# Patient Record
Sex: Female | Born: 1970 | Marital: Married | Smoking: Current some day smoker
Health system: Southern US, Community
[De-identification: ages and names within clinical notes are randomized; demographics above are authoritative.]

## PROBLEM LIST (undated history)

## (undated) DIAGNOSIS — R51 Headache: Secondary | ICD-10-CM

## (undated) DIAGNOSIS — F419 Anxiety disorder, unspecified: Secondary | ICD-10-CM

## (undated) DIAGNOSIS — M1990 Unspecified osteoarthritis, unspecified site: Secondary | ICD-10-CM

## (undated) DIAGNOSIS — F32A Depression, unspecified: Secondary | ICD-10-CM

## (undated) DIAGNOSIS — F329 Major depressive disorder, single episode, unspecified: Secondary | ICD-10-CM

## (undated) DIAGNOSIS — K219 Gastro-esophageal reflux disease without esophagitis: Secondary | ICD-10-CM

## (undated) DIAGNOSIS — R519 Headache, unspecified: Secondary | ICD-10-CM

## (undated) HISTORY — PX: ABDOMINAL HYSTERECTOMY: SHX81

## (undated) HISTORY — PX: KNEE ARTHROSCOPY: SUR90

## (undated) HISTORY — PX: OTHER SURGICAL HISTORY: SHX169

---

## 2016-06-09 DIAGNOSIS — K0889 Other specified disorders of teeth and supporting structures: Secondary | ICD-10-CM | POA: Diagnosis not present

## 2016-07-29 DIAGNOSIS — Z1231 Encounter for screening mammogram for malignant neoplasm of breast: Secondary | ICD-10-CM | POA: Diagnosis not present

## 2016-09-22 DIAGNOSIS — M2391 Unspecified internal derangement of right knee: Secondary | ICD-10-CM | POA: Diagnosis not present

## 2016-09-22 DIAGNOSIS — G8929 Other chronic pain: Secondary | ICD-10-CM | POA: Diagnosis not present

## 2016-09-22 DIAGNOSIS — M25561 Pain in right knee: Secondary | ICD-10-CM | POA: Diagnosis not present

## 2016-09-22 DIAGNOSIS — M1711 Unilateral primary osteoarthritis, right knee: Secondary | ICD-10-CM | POA: Diagnosis not present

## 2016-09-26 ENCOUNTER — Other Ambulatory Visit: Payer: Self-pay | Admitting: Orthopedic Surgery

## 2016-09-26 DIAGNOSIS — G8929 Other chronic pain: Secondary | ICD-10-CM

## 2016-09-26 DIAGNOSIS — M25561 Pain in right knee: Principal | ICD-10-CM

## 2016-10-05 ENCOUNTER — Encounter: Payer: Self-pay | Admitting: Radiology

## 2016-10-05 ENCOUNTER — Ambulatory Visit
Admission: RE | Admit: 2016-10-05 | Discharge: 2016-10-05 | Disposition: A | Discharge status: Home / Self Care | Payer: 59 | Source: Ambulatory Visit | Attending: Orthopedic Surgery | Admitting: Orthopedic Surgery

## 2016-10-05 DIAGNOSIS — M25761 Osteophyte, right knee: Secondary | ICD-10-CM | POA: Diagnosis not present

## 2016-10-05 DIAGNOSIS — M25461 Effusion, right knee: Secondary | ICD-10-CM | POA: Diagnosis not present

## 2016-10-05 DIAGNOSIS — M23300 Other meniscus derangements, unspecified lateral meniscus, right knee: Secondary | ICD-10-CM | POA: Insufficient documentation

## 2016-10-05 DIAGNOSIS — M2241 Chondromalacia patellae, right knee: Secondary | ICD-10-CM | POA: Diagnosis not present

## 2016-10-05 DIAGNOSIS — G8929 Other chronic pain: Secondary | ICD-10-CM

## 2016-10-05 DIAGNOSIS — M25561 Pain in right knee: Secondary | ICD-10-CM | POA: Insufficient documentation

## 2016-10-13 DIAGNOSIS — M2391 Unspecified internal derangement of right knee: Secondary | ICD-10-CM | POA: Diagnosis not present

## 2016-10-25 DIAGNOSIS — G2581 Restless legs syndrome: Secondary | ICD-10-CM | POA: Diagnosis not present

## 2016-10-25 DIAGNOSIS — Z131 Encounter for screening for diabetes mellitus: Secondary | ICD-10-CM | POA: Diagnosis not present

## 2016-10-28 ENCOUNTER — Encounter
Admission: RE | Admit: 2016-10-28 | Discharge: 2016-10-28 | Disposition: A | Discharge status: Home / Self Care | Payer: 59 | Source: Ambulatory Visit | Attending: Orthopedic Surgery | Admitting: Orthopedic Surgery

## 2016-10-28 HISTORY — DX: Headache, unspecified: R51.9

## 2016-10-28 HISTORY — DX: Major depressive disorder, single episode, unspecified: F32.9

## 2016-10-28 HISTORY — DX: Depression, unspecified: F32.A

## 2016-10-28 HISTORY — DX: Headache: R51

## 2016-10-28 HISTORY — DX: Unspecified osteoarthritis, unspecified site: M19.90

## 2016-10-28 HISTORY — DX: Anxiety disorder, unspecified: F41.9

## 2016-10-28 HISTORY — DX: Gastro-esophageal reflux disease without esophagitis: K21.9

## 2016-10-28 NOTE — Patient Instructions (Signed)
  Your procedure is scheduled on: 11-07-16  Report to Same Day Surgery 2nd floor medical mall [facility] Entrance-take elevator on left to 2nd floor.  Check in with surgery information desk.) To find out your arrival time please call [phone] between 1PM - 3PM on 11-04-16  Remember: Instructions that are not followed completely may result in serious medical risk, up to and including death, or upon the discretion of your surgeon and anesthesiologist your surgery may need to be rescheduled.    _x___ 1. Do not eat food or drink liquids after midnight. No gum chewing or hard candies.     __x__ 2. No Alcohol for 24 hours before or after surgery.   __x__3. No Smoking for 24 prior to surgery.   ____  4. Bring all medications with you on the day of surgery if instructed.    __x__ 5. Notify your doctor if there is any change in your medical condition     (cold, fever, infections).     Do not wear jewelry, make-up, hairpins, clips or nail polish.  Do not wear lotions, powders, or perfumes. You may wear deodorant.  Do not shave 48 hours prior to surgery. Men may shave face and neck.  Do not bring valuables to the hospital.    [facility] is not responsible for any belongings or valuables.               Contacts, dentures or bridgework may not be worn into surgery.  Leave your suitcase in the car. After surgery it may be brought to your room.  For patients admitted to the hospital, discharge time is determined by your                       treatment team.   Patients discharged the day of surgery will not be allowed to drive home.  You will need someone to drive you home and stay with you the night of your procedure.    Please read over the following fact sheets that you were given:   [facility] Preparing for Surgery and or MRSA Information   _x___ Take anti-hypertensive (unless it includes a diuretic), cardiac, seizure, asthma,     anti-reflux and psychiatric medicines. These  include:  1. PRILOSEC  2. TAKE AN EXTRA PRILOSEC THE NIGHT BEFORE SURGERY  3.  4.  5.  6.  ____Fleets enema or Magnesium Citrate as directed.   ____ Use CHG Soap or sage wipes as directed on instruction sheet   ____ Use inhalers on the day of surgery and bring to hospital day of surgery  ____ Stop Metformin and Janumet 2 days prior to surgery.    ____ Take 1/2 of usual insulin dose the night before surgery and none on the morning surgery.   ____ Follow recommendations from Cardiologist, Pulmonologist or PCP regarding          stopping Aspirin, Coumadin, Pllavix ,Eliquis, Effient, or Pradaxa, and Pletal.  X____Stop Anti-inflammatories such as Advil, Aleve, Ibuprofen, Motrin, Naproxen, Naprosyn, Goodies powders or aspirin products 7 DAYS PRIOR TO SURGERY-OK to take Tylenol   _x___ Stop supplements until after surgery-PT TO STOP THE FISH OIL THAT IS IN HER VITAMIN PACKET 7 DAYS PRIOR TO SURGERY (PT INSTRUCTED ANY OTHER SUPPLEMENT IN PACKET SHE NEEDS TO STOP AND IF IT HAS VIT E IN IT, IT NEEDS TO BE STOPPED ALSO)   ____ Bring C-Pap to the hospital.

## 2016-11-02 DIAGNOSIS — M549 Dorsalgia, unspecified: Secondary | ICD-10-CM | POA: Diagnosis not present

## 2016-11-02 DIAGNOSIS — Z0000 Encounter for general adult medical examination without abnormal findings: Secondary | ICD-10-CM | POA: Diagnosis not present

## 2016-11-07 ENCOUNTER — Ambulatory Visit: Payer: 59 | Admitting: Anesthesiology

## 2016-11-07 ENCOUNTER — Encounter: Payer: Self-pay | Admitting: Orthopedic Surgery

## 2016-11-07 ENCOUNTER — Ambulatory Visit
Admission: RE | Admit: 2016-11-07 | Discharge: 2016-11-07 | Disposition: A | Discharge status: Home / Self Care | Payer: 59 | Source: Ambulatory Visit | Attending: Orthopedic Surgery | Admitting: Orthopedic Surgery

## 2016-11-07 ENCOUNTER — Encounter
Admission: RE | Disposition: A | Discharge status: Home / Self Care | Payer: Self-pay | Source: Ambulatory Visit | Attending: Orthopedic Surgery

## 2016-11-07 DIAGNOSIS — F419 Anxiety disorder, unspecified: Secondary | ICD-10-CM | POA: Insufficient documentation

## 2016-11-07 DIAGNOSIS — F329 Major depressive disorder, single episode, unspecified: Secondary | ICD-10-CM | POA: Insufficient documentation

## 2016-11-07 DIAGNOSIS — I2510 Atherosclerotic heart disease of native coronary artery without angina pectoris: Secondary | ICD-10-CM | POA: Insufficient documentation

## 2016-11-07 DIAGNOSIS — Z6841 Body Mass Index (BMI) 40.0 and over, adult: Secondary | ICD-10-CM | POA: Diagnosis not present

## 2016-11-07 DIAGNOSIS — J449 Chronic obstructive pulmonary disease, unspecified: Secondary | ICD-10-CM | POA: Diagnosis not present

## 2016-11-07 DIAGNOSIS — I10 Essential (primary) hypertension: Secondary | ICD-10-CM | POA: Insufficient documentation

## 2016-11-07 DIAGNOSIS — M23251 Derangement of posterior horn of lateral meniscus due to old tear or injury, right knee: Secondary | ICD-10-CM | POA: Diagnosis not present

## 2016-11-07 DIAGNOSIS — X58XXXA Exposure to other specified factors, initial encounter: Secondary | ICD-10-CM | POA: Diagnosis not present

## 2016-11-07 DIAGNOSIS — Z79899 Other long term (current) drug therapy: Secondary | ICD-10-CM | POA: Insufficient documentation

## 2016-11-07 DIAGNOSIS — E669 Obesity, unspecified: Secondary | ICD-10-CM | POA: Diagnosis not present

## 2016-11-07 DIAGNOSIS — M94261 Chondromalacia, right knee: Secondary | ICD-10-CM | POA: Diagnosis not present

## 2016-11-07 DIAGNOSIS — F17210 Nicotine dependence, cigarettes, uncomplicated: Secondary | ICD-10-CM | POA: Diagnosis not present

## 2016-11-07 DIAGNOSIS — Y929 Unspecified place or not applicable: Secondary | ICD-10-CM | POA: Insufficient documentation

## 2016-11-07 DIAGNOSIS — S83281A Other tear of lateral meniscus, current injury, right knee, initial encounter: Secondary | ICD-10-CM | POA: Insufficient documentation

## 2016-11-07 DIAGNOSIS — M2241 Chondromalacia patellae, right knee: Secondary | ICD-10-CM | POA: Diagnosis not present

## 2016-11-07 HISTORY — PX: CHONDROPLASTY: SHX5177

## 2016-11-07 HISTORY — PX: KNEE ARTHROSCOPY: SHX127

## 2016-11-07 HISTORY — PX: MENISCUS REPAIR: SHX5179

## 2016-11-07 SURGERY — ARTHROSCOPY, KNEE
Anesthesia: General | Laterality: Right | Wound class: Clean

## 2016-11-07 MED ORDER — BUPIVACAINE HCL (PF) 0.25 % IJ SOLN
INTRAMUSCULAR | Status: AC
  Filled 2016-11-07: qty 30

## 2016-11-07 MED ORDER — ONDANSETRON HCL 4 MG/2ML IJ SOLN
INTRAMUSCULAR | Status: DC | PRN
  Administered 2016-11-07: 4 mg via INTRAVENOUS

## 2016-11-07 MED ORDER — HYDROCODONE-ACETAMINOPHEN 5-325 MG PO TABS: 1.0000 | ORAL_TABLET | ORAL | 0 refills | Status: DC | PRN

## 2016-11-07 MED ORDER — LIDOCAINE HCL (CARDIAC) 20 MG/ML IV SOLN
INTRAVENOUS | Status: DC | PRN
  Administered 2016-11-07: 80 mg via INTRAVENOUS

## 2016-11-07 MED ORDER — PROMETHAZINE HCL 25 MG/ML IJ SOLN: 6.2500 mg | INTRAMUSCULAR | Status: DC | PRN

## 2016-11-07 MED ORDER — MORPHINE SULFATE (PF) 4 MG/ML IV SOLN
INTRAVENOUS | Status: AC
  Filled 2016-11-07: qty 1

## 2016-11-07 MED ORDER — EPINEPHRINE PF 1 MG/ML IJ SOLN
INTRAMUSCULAR | Status: AC
  Filled 2016-11-07: qty 1

## 2016-11-07 MED ORDER — GLYCOPYRROLATE 0.2 MG/ML IJ SOLN
INTRAMUSCULAR | Status: DC | PRN
  Administered 2016-11-07: 0.2 mg via INTRAVENOUS

## 2016-11-07 MED ORDER — ACETAMINOPHEN 10 MG/ML IV SOLN
INTRAVENOUS | Status: DC | PRN
Start: 2016-11-07 — End: 2016-11-07
  Administered 2016-11-07: 1000 mg via INTRAVENOUS

## 2016-11-07 MED ORDER — ACETAMINOPHEN 10 MG/ML IV SOLN
INTRAVENOUS | Status: AC
  Filled 2016-11-07: qty 100

## 2016-11-07 MED ORDER — FENTANYL CITRATE (PF) 100 MCG/2ML IJ SOLN
INTRAMUSCULAR | Status: AC
  Filled 2016-11-07: qty 2

## 2016-11-07 MED ORDER — FENTANYL CITRATE (PF) 100 MCG/2ML IJ SOLN
INTRAMUSCULAR | Status: DC | PRN
  Administered 2016-11-07 (×2): 50 ug via INTRAVENOUS
  Administered 2016-11-07 (×2): 25 ug via INTRAVENOUS
  Administered 2016-11-07: 50 ug via INTRAVENOUS

## 2016-11-07 MED ORDER — BUPIVACAINE-EPINEPHRINE 0.25% -1:200000 IJ SOLN
INTRAMUSCULAR | Status: DC | PRN
  Administered 2016-11-07: 30 mL

## 2016-11-07 MED ORDER — CHLORHEXIDINE GLUCONATE 4 % EX LIQD: 60.0000 mL | Freq: Once | CUTANEOUS | Status: DC

## 2016-11-07 MED ORDER — MEPERIDINE HCL 50 MG/ML IJ SOLN: 6.2500 mg | INTRAMUSCULAR | Status: DC | PRN

## 2016-11-07 MED ORDER — DEXAMETHASONE SODIUM PHOSPHATE 10 MG/ML IJ SOLN
INTRAMUSCULAR | Status: AC
  Filled 2016-11-07: qty 1

## 2016-11-07 MED ORDER — DEXAMETHASONE SODIUM PHOSPHATE 10 MG/ML IJ SOLN
INTRAMUSCULAR | Status: DC | PRN
Start: 2016-11-07 — End: 2016-11-07
  Administered 2016-11-07: 10 mg via INTRAVENOUS

## 2016-11-07 MED ORDER — MIDAZOLAM HCL 2 MG/2ML IJ SOLN
INTRAMUSCULAR | Status: DC | PRN
  Administered 2016-11-07: 2 mg via INTRAVENOUS

## 2016-11-07 MED ORDER — OXYCODONE HCL 5 MG/5ML PO SOLN: 5.0000 mg | Freq: Once | ORAL | Status: DC | PRN

## 2016-11-07 MED ORDER — ONDANSETRON HCL 4 MG/2ML IJ SOLN
INTRAMUSCULAR | Status: AC
  Filled 2016-11-07: qty 2

## 2016-11-07 MED ORDER — PROPOFOL 10 MG/ML IV BOLUS
INTRAVENOUS | Status: AC
  Filled 2016-11-07: qty 20

## 2016-11-07 MED ORDER — MORPHINE SULFATE 4 MG/ML IJ SOLN
INTRAMUSCULAR | Status: DC | PRN
  Administered 2016-11-07: 4 mg via INTRAVENOUS

## 2016-11-07 MED ORDER — LIDOCAINE HCL (PF) 2 % IJ SOLN
INTRAMUSCULAR | Status: AC
  Filled 2016-11-07: qty 2

## 2016-11-07 MED ORDER — FENTANYL CITRATE (PF) 100 MCG/2ML IJ SOLN
25.0000 ug | INTRAMUSCULAR | Status: DC | PRN
  Administered 2016-11-07 (×4): 25 ug via INTRAVENOUS

## 2016-11-07 MED ORDER — OXYCODONE HCL 5 MG PO TABS: 5.0000 mg | ORAL_TABLET | Freq: Once | ORAL | Status: DC | PRN

## 2016-11-07 MED ORDER — LACTATED RINGERS IV SOLN
INTRAVENOUS | Status: DC
  Administered 2016-11-07: 15:00:00 via INTRAVENOUS

## 2016-11-07 MED ORDER — PROPOFOL 10 MG/ML IV BOLUS
INTRAVENOUS | Status: DC | PRN
  Administered 2016-11-07: 20 mg via INTRAVENOUS
  Administered 2016-11-07: 180 mg via INTRAVENOUS

## 2016-11-07 MED ORDER — MIDAZOLAM HCL 2 MG/2ML IJ SOLN
INTRAMUSCULAR | Status: AC
Start: 2016-11-07 — End: 2016-11-07
  Filled 2016-11-07: qty 2

## 2016-11-07 SURGICAL SUPPLY — 22 items
BLADE SHAVER 4.5 DBL SERAT CV (CUTTER) IMPLANT
BNDG ESMARK 6X12 TAN STRL LF (GAUZE/BANDAGES/DRESSINGS) ×2 IMPLANT
CUFF TOURN 34 STER (MISCELLANEOUS) ×2 IMPLANT
DRSG DERMACEA 8X12 NADH (GAUZE/BANDAGES/DRESSINGS) ×2 IMPLANT
DURAPREP 26ML APPLICATOR (WOUND CARE) ×2 IMPLANT
GAUZE SPONGE 4X4 12PLY STRL (GAUZE/BANDAGES/DRESSINGS) ×2 IMPLANT
GLOVE BIOGEL M STRL SZ7.5 (GLOVE) ×2 IMPLANT
GLOVE INDICATOR 8.0 STRL GRN (GLOVE) ×2 IMPLANT
GOWN STRL REUS W/ TWL LRG LVL3 (GOWN DISPOSABLE) ×2 IMPLANT
GOWN STRL REUS W/TWL LRG LVL3 (GOWN DISPOSABLE) ×2
IV LACTATED RINGER IRRG 3000ML (IV SOLUTION) ×9
IV LR IRRIG 3000ML ARTHROMATIC (IV SOLUTION) ×9 IMPLANT
KIT RM TURNOVER STRD PROC AR (KITS) ×2 IMPLANT
MANIFOLD NEPTUNE II (INSTRUMENTS) ×2 IMPLANT
PACK ARTHROSCOPY KNEE (MISCELLANEOUS) ×2 IMPLANT
SET TUBE SUCT SHAVER OUTFL 24K (TUBING) ×2 IMPLANT
SET TUBE TIP INTRA-ARTICULAR (MISCELLANEOUS) ×2 IMPLANT
SUT ETHILON 3-0 FS-10 30 BLK (SUTURE) ×2
SUTURE EHLN 3-0 FS-10 30 BLK (SUTURE) ×1 IMPLANT
TUBING ARTHRO INFLOW-ONLY STRL (TUBING) ×2 IMPLANT
WAND COBLATION FLOW 50 (SURGICAL WAND) ×2 IMPLANT
WRAP KNEE W/COLD PACKS 25.5X14 (SOFTGOODS) ×2 IMPLANT

## 2016-11-07 NOTE — Transfer of Care (Signed)
Immediate Anesthesia Transfer of Care Note  Patient: Joanne Galloway  Procedure(s) Performed: Procedure(s): ARTHROSCOPY KNEE (Right) CHONDROPLASTY (Right) REPAIR OF MENISCUS (Right)  Patient Location: PACU  Anesthesia Type:General  Level of Consciousness: awake  Airway & Oxygen Therapy: Patient Spontanous Breathing and Patient connected to face mask oxygen  Post-op Assessment: Report given to RN and Post -op Vital signs reviewed and stable  Post vital signs: Reviewed and stable  Last Vitals:  Vitals:   11/07/16 1425  BP: 112/80  Pulse: 75  Resp: 20  Temp: 37 C    Last Pain:  Vitals:   11/07/16 1425  TempSrc: Oral      Patients Stated Pain Goal: 0 (71/85/50 1586)  Complications: No apparent anesthesia complications

## 2016-11-07 NOTE — Anesthesia Post-op Follow-up Note (Cosign Needed)
Anesthesia QCDR form completed.        

## 2016-11-07 NOTE — Anesthesia Preprocedure Evaluation (Signed)
Anesthesia Evaluation  Patient identified by MRN, date of birth, ID band Patient awake    Reviewed: Allergy & Precautions, NPO status , Patient's Chart, lab work & pertinent test results  History of Anesthesia Complications Negative for: history of anesthetic complications  Airway Mallampati: II  TM Distance: >3 FB Neck ROM: Full    Dental  (+) Missing   Pulmonary neg sleep apnea, neg COPD, Current Smoker,    breath sounds clear to auscultation- rhonchi (-) wheezing      Cardiovascular Exercise Tolerance: Good (-) hypertension(-) CAD, (-) Past MI and (-) Cardiac Stents  Rhythm:Regular Rate:Normal - Systolic murmurs and - Diastolic murmurs    Neuro/Psych  Headaches, PSYCHIATRIC DISORDERS Anxiety Depression    GI/Hepatic Neg liver ROS, GERD  ,  Endo/Other  negative endocrine ROSneg diabetes  Renal/GU negative Renal ROS     Musculoskeletal  (+) Arthritis ,   Abdominal (+) + obese,   Peds  Hematology negative hematology ROS (+)   Anesthesia Other Findings Past Medical History: No date: Anxiety No date: Arthritis     Comment:  KNEES BIL No date: Depression No date: GERD (gastroesophageal reflux disease) No date: Headache     Comment:  H/O MIGRAINES   Reproductive/Obstetrics                             Anesthesia Physical Anesthesia Plan  ASA: II  Anesthesia Plan: General   Post-op Pain Management:    Induction: Intravenous  PONV Risk Score and Plan: 1 and Ondansetron and Dexamethasone  Airway Management Planned: LMA  Additional Equipment:   Intra-op Plan:   Post-operative Plan:   Informed Consent: I have reviewed the patients History and Physical, chart, labs and discussed the procedure including the risks, benefits and alternatives for the proposed anesthesia with the patient or authorized representative who has indicated his/her understanding and acceptance.   Dental  advisory given  Plan Discussed with: CRNA and Anesthesiologist  Anesthesia Plan Comments:         Anesthesia Quick Evaluation

## 2016-11-07 NOTE — Op Note (Signed)
OPERATIVE NOTE  DATE OF SURGERY:  11/07/2016  PATIENT NAME:  Joanne Galloway   DOB: [birth year]  MRN: [id number]   PRE-OPERATIVE DIAGNOSIS:  Internal derangement of the right knee   POST-OPERATIVE DIAGNOSIS:   Tear of the lateral meniscus, right knee Grade 3 chondromalacia of the medial, lateral, patellofemoral compartments, right knee  PROCEDURE:  Right knee arthroscopy, partial lateral meniscectomy, and chondroplasty  SURGEON:  Marciano Sequin., M.D.   ASSISTANT: none  ANESTHESIA: general  ESTIMATED BLOOD LOSS: Minimal  FLUIDS REPLACED: 1000 mL of crystalloid  TOURNIQUET TIME: Not used   DRAINS: none  IMPLANTS UTILIZED: None  INDICATIONS FOR SURGERY: Joanne Galloway is a 46 y.o. year old female who has been seen for complaints of right knee pain. MRI demonstrated findings consistent with meniscal pathology. After discussion of the risks and benefits of surgical intervention, the patient expressed understanding of the risks benefits and agree with plans for right knee arthroscopy.   PROCEDURE IN DETAIL: The patient was brought into the operating room and, after adequate general anesthesia was achieved, a tourniquet was applied to the right thigh and the leg was placed in the leg holder. All bony prominences were well padded. The patient's right knee was cleaned and prepped with alcohol and Duraprep and draped in the usual sterile fashion. A "timeout" was performed as per usual protocol. The anticipated portal sites were injected with 0.25% Marcaine with epinephrine. An anterolateral incision was made and a cannula was inserted. A moderate effusion was evacuated and the knee was distended with fluid using the pump. The scope was advanced down the medial gutter into the medial compartment. Under visualization with the scope, an anteromedial portal was created and a hooked probe was inserted. The medial meniscus was visualized and probed. The medial meniscus was intact. The articular  cartilage was visualized. Severe fibrillation consistent with grade 3 chondromalacia was noted to both the medial femoral condyle and medial tibial plateau. These areas were debrided and contoured using the ArthroCare wand.  The scope was then advanced into the intercondylar notch. The anterior cruciate ligament was visualized and probed and felt to be intact. The scope was removed from the lateral portal and reinserted via the anteromedial portal to better visualize the lateral compartment. The lateral meniscus was visualized and probed. The lateral meniscus had a discoid-like appearance and narrow were several radial tears emanating from the central region. The tears were debrided using meniscal punches and then contoured using the ArthroCare wand. The remaining rim of meniscus was visualized and probed and felt to be stable. The articular cartilage of the lateral compartment was visualized. The lateral femoral condyle was in good condition, although there were grade 3 changes of chondromalacia involving the lateral tibial plateau. These areas were debrided and contoured using the ArthroCare wand. Finally, the scope was advanced so as to visualize the patellofemoral articulation. Good patellar tracking was appreciated. Grade 3 changes of chondromalacia were noted to both the sulcus and the patella. These areas were debrided and contoured using the ArthroCare wand.  The knee was irrigated with copius amounts of fluid and suctioned dry. The anterolateral portal was re-approximated with #3-0 nylon. A combination of 0.25% Marcaine with epinephrine and 4 mg of Morphine were injected via the scope. The scope was removed and the anteromedial portal was re-approximated with #3-0 nylon. A sterile dressing was applied followed by application of an ice wrap.  The patient tolerated the procedure well and was transported to the PACU in stable condition.  James P. Holley Bouche., M.D.

## 2016-11-07 NOTE — H&P (Signed)
The patient has been re-examined, and the chart reviewed, and there have been no interval changes to the documented history and physical.    The risks, benefits, and alternatives have been discussed at length. The patient expressed understanding of the risks benefits and agreed with plans for surgical intervention.  Rolena Knutson P. Shaquana Buel, Jr. M.D.    

## 2016-11-07 NOTE — Discharge Instructions (Signed)
°  Instructions after Knee Arthroscopy  ° °- James P. Hooten, Jr., M.D.    ° Dept. of Orthopaedics & Sports Medicine ° [facility] ° [address] ° [place], [place]  [address] ° ° Phone: [phone]   Fax: [phone] ° ° °DIET: °• Drink plenty of non-alcoholic fluids & begin a light diet. °• Resume your normal diet the day after surgery. ° °ACTIVITY:  °• You may use crutches or a walker with weight-bearing as tolerated, unless instructed otherwise. °• You may wean yourself off of the walker or crutches as tolerated.  °• Begin doing gentle exercises. Exercising will reduce the pain and swelling, increase motion, and prevent muscle weakness.   °• Avoid strenuous activities or athletics for a minimum of 4-6 weeks after arthroscopic surgery. °• Do not drive or operate any equipment until instructed. ° °WOUND CARE:  °• Place one to two pillows under the knee the first day or two when sitting or lying.  °• Continue to use the ice packs periodically to reduce pain and swelling. °• The small incisions in your knee are closed with nylon stitches. The stitches will be removed in the office. °• The bulky dressing may be removed on the second day after surgery. DO NOT TOUCH THE STITCHES. Put a Band-Aid over each stitch. Do NOT use any ointments or creams on the incisions.  °• You may bathe or shower after the stitches are removed at the first office visit following surgery. ° °MEDICATIONS: °• You may resume your regular medications. °• Please take the pain medication as prescribed. °• Do not take pain medication on an empty stomach. °• Do not drive or drink alcoholic beverages when taking pain medications. ° °CALL THE OFFICE FOR: °• Temperature above 101 degrees °• Excessive bleeding or drainage on the dressing. °• Excessive swelling, coldness, or paleness of the toes. °• Persistent nausea and vomiting. ° °FOLLOW-UP:  °• You should have an appointment to return to the office in 7-10 days after surgery.   ° ° °AMBULATORY SURGERY  °DISCHARGE INSTRUCTIONS ° ° °1) The drugs that you were given will stay in your system until tomorrow so for the next 24 hours you should not: ° °A) Drive an automobile °B) Make any legal decisions °C) Drink any alcoholic beverage ° ° °2) You may resume regular meals tomorrow.  Today it is better to start with liquids and gradually work up to solid foods. ° °You may eat anything you prefer, but it is better to start with liquids, then soup and crackers, and gradually work up to solid foods. ° ° °3) Please notify your doctor immediately if you have any unusual bleeding, trouble breathing, redness and pain at the surgery site, drainage, fever, or pain not relieved by medication. ° ° ° °4) Additional Instructions: ° ° ° ° ° ° ° °Please contact your physician with any problems or Same Day Surgery at [phone], Monday through Friday 6 am to 4 pm, or [place] [facility] [place] Main number at [phone]. ° °

## 2016-11-07 NOTE — Anesthesia Procedure Notes (Signed)
Procedure Name: LMA Insertion Date/Time: 11/07/2016 2:53 PM Performed by: Johnna Acosta Pre-anesthesia Checklist: Patient identified, Emergency Drugs available, Suction available, Patient being monitored and Timeout performed Patient Re-evaluated:Patient Re-evaluated prior to induction Oxygen Delivery Method: Circle system utilized Preoxygenation: Pre-oxygenation with 100% oxygen Induction Type: IV induction LMA: LMA inserted LMA Size: 4.0 Tube type: Oral Number of attempts: 1 Placement Confirmation: ETT inserted through vocal cords under direct vision,  positive ETCO2 and breath sounds checked- equal and bilateral Tube secured with: Tape Dental Injury: Teeth and Oropharynx as per pre-operative assessment

## 2016-11-08 ENCOUNTER — Encounter: Payer: Self-pay | Admitting: Orthopedic Surgery

## 2016-11-08 NOTE — Anesthesia Postprocedure Evaluation (Signed)
Anesthesia Post Note  Patient: Animator  Procedure(s) Performed: Procedure(s) (LRB): ARTHROSCOPY KNEE (Right) CHONDROPLASTY (Right) REPAIR OF MENISCUS (Right)  Patient location during evaluation: PACU Anesthesia Type: General Level of consciousness: awake and alert and oriented Pain management: pain level controlled Vital Signs Assessment: post-procedure vital signs reviewed and stable Respiratory status: spontaneous breathing, nonlabored ventilation and respiratory function stable Cardiovascular status: blood pressure returned to baseline and stable Postop Assessment: no signs of nausea or vomiting Anesthetic complications: no     Last Vitals:  Vitals:   11/07/16 1735 11/07/16 1830  BP: 128/72 136/80  Pulse: 65 61  Resp: 16 16  Temp: (!) 36.1 C     Last Pain:  Vitals:   11/07/16 1830  TempSrc:   PainSc: 4                  Lilyan Prete

## 2016-11-11 NOTE — H&P (Signed)
Progress Notes (TO SERVE AS HISTORY & PHYSICAL) Encounter Date: 10/13/2016   Joanne Galloway Philmon Holley Bouche., MD  Orthopedic Surgery  Expand All Collapse All   [] Hide copied text Chief Complaint:     Chief Complaint  Patient presents with  . Follow-up    Right Knee MRI results    Reason for Visit: The patient is a 46 y.o.femalewho presents today for reevaluation of herrightknee. Shereports a severalyear(s)history of rightknee pain. She does not recall any specific trauma or aggravating event.Shelocalizes most of the pain along the lateralaspect of the knee. Shereports someswelling, somelocking, and somegiving way of the knee. The pain is aggravated by going up and down stairs, kneeling, lateral movements, pivoting and squatting. The patient has not appreciated any significant improvement despite weight loss, activity modification, low impact exercise, intraarticular corticosteroid injections, and viscosupplementation.  Medications: CurrentMedications        Current Outpatient Prescriptions  Medication Sig Dispense Refill  . omeprazole (PRILOSEC) 40 MG DR capsule TAKE 1 CAPSULE BY MOUTH  ONCE DAILY 90 capsule 1  . traMADol (ULTRAM) 50 mg tablet Take 1 tablet (50 mg total) by mouth once daily as needed for Pain. 30 tablet 2  . rOPINIRole (REQUIP) 0.5 MG tablet TAKE 1 TABLET BY MOUTH  NIGHTLY (Patient not taking: Reported on 09/22/2016) 90 tablet 1   No current facility-administered medications for this visit.       Allergies:     Allergies  Allergen Reactions  . Codeine Syncope    Past Medical History:     Past Medical History:  Diagnosis Date  . Allergic state   . Arthritis   . Depression, unspecified   . Fibromyalgia   . GERD (gastroesophageal reflux disease)   . Hypertension   . Migraines   . Obesity, unspecified   . Osteoarthritis of both knees   . Pain syndrome, chronic    questionable  . PUD (peptic ulcer disease)     Past  Surgical History:      Past Surgical History:  Procedure Laterality Date  . CESAREAN SECTION    . CHOLECYSTECTOMY    . fibroid tumor    . HYSTERECTOMY    . right knee arthroscopy    . TUBAL LIGATION      Social History: SocialHistory  Social History        Social History  . Marital status: Married    Spouse name: Aaron Edelman  . Number of children: 2  . Years of education: 16       Occupational History  . Part-time    Social History Main Topics  . Smoking status: Current Every Day Smoker    Packs/day: 0.50    Years: 4.00    Types: Cigarettes  . Smokeless tobacco: Never Used     Comment: On Chantax  . Alcohol use No  . Drug use: No  . Sexual activity: Yes    Partners: Male    Birth control/ protection: None       Other Topics Concern  . Not on file      Social History Narrative  . No narrative on file      Family History:      Family History  Problem Relation Age of Onset  . Cancer Mother   . Huntington's disease Father   . Cancer Maternal Aunt   . Breast cancer Maternal Aunt   . Colon polyps Maternal Grandfather     Review of Systems: A comprehensive 14 point ROS was performed,  reviewed, and the pertinent orthopaedic findings are documented in the HPI.  Exam BP 128/88   Ht 167.6 cm (5\' 6" )   Wt (!) 116.6 kg (257 lb)   BMI 41.48 kg/m   General:  Well-developed, well-nourished female seen in no acute distress.  Even heel to toe gait.  No varus or valgus thrust to the left knee.  HEENT:  Atraumatic, normocephalic.  Pupils are equal and reactive to light.  Extraocular motion is intact. Sclera are clear.  Oropharynx is clear with moist mucosa.  Lungs:  Clear to auscultation bilaterally.  Cardiovascular: Regular rate and rhythm.  Normal S1, S2.  No murmur .  No appreciable gallops or rubs. Peripheral pulses are palpable.  No lower extremity edema.  Homan`s test is negative.   Extremities: Good  strength, stability, and range of motion of the upper extremities. Good range of motion of the hips and ankles.  Left Knee:          Soft tissue swelling: mild    Effusion:                   none    Erythema:                 none    Crepitance:               none    Tenderness:             lateral joint line    Alignment:                normal    Mediolateral laxity:   stable    Anterior drawer test:negative    Lachman`s test:       negative    McMurray`s test:      positive    Atrophy:                    No significant atrophy.                                       Quadriceps tone was fair to good.    Range of Motion:     Greater than 110 degrees  Neurologic:  Awake, alert, and oriented.  Sensory function is intact to pinprick and light touch.   Motor strength is judged to be 5/5.   Motor coordination is within normal limits.   No apparent clonus. No tremor.     MRI: I reviewed the left knee MRI from [facility] dated 10/05/2016.  I concur with the radiologist's interpretation as below:  MRI OF THE RIGHT KNEE WITHOUT CONTRAST  TECHNIQUE: Multiplanar, multisequence MR imaging of the knee was performed. No intravenous contrast was administered.  COMPARISON:None.  FINDINGS: MENISCI  Medial meniscus:Intact.  Lateral meniscus:Partial discoid meniscus.  LIGAMENTS  Cruciates:Intact ACL and PCL.  Collaterals: Medial collateral ligament is intact. Lateral collateral ligament complex is intact.  CARTILAGE  Patellofemoral: Focal full-thickness cartilage loss of the inferior and middle aspects of the medial facet of the patella. Irregular thinning of the lateral facet cartilage.  Medial:Normal.  Lateral: Irregular fissures in the inner aspect of the lateral tibial plateau best seen on image 22 of series 3.  Joint:Moderate joint effusion.  Popliteal Fossa:No Baker cyst. Intact popliteus tendon.  Extensor Mechanism:Intact  quadriceps tendon and patellar tendon.  Bones:Small tricompartmental marginal osteophytes.  Other: None  IMPRESSION: Chondromalacia of the patellofemoral and lateral compartments as described. Incomplete lateral discoid meniscus.  Electronically Signed By: Chip Boer M.D. On: 10/05/2016 15:02   Impression: Internal derangement of the left knee  Plan:   The findings were discussed in detail with the patient. The patient was given informational material on knee arthroscopy. Conservative treatment options were reviewed with the patient.  We discussed the risks and benefits of surgical intervention.  The usual perioperative course was also discussed in detail.  The patient expressed understanding of the risks and benefits of surgical intervention and would like to proceed with plans for left knee arthroscopy.  MEDICAL CLEARANCE: Per anesthesiology. ACTIVITIES:  Avoid pivoting, squatting, or twisting. WORK STATUS: Not applicable. THERAPY: Quadriceps strengthening exercises. MEDICATIONS:    New Prescriptions   No medications on file   FOLLOW-UP: Return for preop History & Physical pending surgery date.       James P. Holley Bouche., M.D.  This note was generated in part with voice recognition software and I apologize for any typographical errors that were not detected and corrected.     Electronically signed by Lamar Benes., MD at 10/16/2016 9:31 PM

## 2016-12-03 DIAGNOSIS — G4733 Obstructive sleep apnea (adult) (pediatric): Secondary | ICD-10-CM | POA: Diagnosis not present

## 2017-01-16 DIAGNOSIS — G8929 Other chronic pain: Secondary | ICD-10-CM | POA: Diagnosis not present

## 2017-01-16 DIAGNOSIS — R109 Unspecified abdominal pain: Secondary | ICD-10-CM | POA: Diagnosis not present

## 2017-01-16 DIAGNOSIS — G4733 Obstructive sleep apnea (adult) (pediatric): Secondary | ICD-10-CM | POA: Diagnosis not present

## 2017-01-24 ENCOUNTER — Other Ambulatory Visit: Payer: Self-pay | Admitting: Internal Medicine

## 2017-01-24 DIAGNOSIS — R109 Unspecified abdominal pain: Principal | ICD-10-CM

## 2017-01-24 DIAGNOSIS — G8929 Other chronic pain: Secondary | ICD-10-CM

## 2017-02-02 ENCOUNTER — Ambulatory Visit
Admission: RE | Admit: 2017-02-02 | Discharge: 2017-02-02 | Disposition: A | Discharge status: Home / Self Care | Payer: 59 | Source: Ambulatory Visit | Attending: Internal Medicine | Admitting: Internal Medicine

## 2017-02-02 DIAGNOSIS — R109 Unspecified abdominal pain: Secondary | ICD-10-CM | POA: Diagnosis not present

## 2017-02-02 DIAGNOSIS — G8929 Other chronic pain: Secondary | ICD-10-CM | POA: Diagnosis not present

## 2017-02-03 ENCOUNTER — Other Ambulatory Visit: Payer: Self-pay | Admitting: Internal Medicine

## 2017-02-03 DIAGNOSIS — R109 Unspecified abdominal pain: Secondary | ICD-10-CM

## 2017-02-06 ENCOUNTER — Ambulatory Visit: Payer: 59

## 2017-02-16 DIAGNOSIS — N23 Unspecified renal colic: Secondary | ICD-10-CM | POA: Diagnosis not present

## 2017-02-16 DIAGNOSIS — N390 Urinary tract infection, site not specified: Secondary | ICD-10-CM | POA: Diagnosis not present

## 2017-02-21 DIAGNOSIS — R1012 Left upper quadrant pain: Secondary | ICD-10-CM | POA: Diagnosis not present

## 2017-02-22 DIAGNOSIS — N390 Urinary tract infection, site not specified: Secondary | ICD-10-CM | POA: Diagnosis not present

## 2017-02-22 DIAGNOSIS — R300 Dysuria: Secondary | ICD-10-CM | POA: Diagnosis not present

## 2017-02-22 DIAGNOSIS — M5489 Other dorsalgia: Secondary | ICD-10-CM | POA: Diagnosis not present

## 2017-08-23 DIAGNOSIS — Z1231 Encounter for screening mammogram for malignant neoplasm of breast: Secondary | ICD-10-CM | POA: Diagnosis not present

## 2017-08-23 DIAGNOSIS — Z9289 Personal history of other medical treatment: Secondary | ICD-10-CM | POA: Diagnosis not present

## 2017-09-27 DIAGNOSIS — L820 Inflamed seborrheic keratosis: Secondary | ICD-10-CM | POA: Diagnosis not present

## 2017-09-27 DIAGNOSIS — L538 Other specified erythematous conditions: Secondary | ICD-10-CM | POA: Diagnosis not present

## 2017-09-27 DIAGNOSIS — L814 Other melanin hyperpigmentation: Secondary | ICD-10-CM | POA: Diagnosis not present

## 2017-09-27 DIAGNOSIS — D485 Neoplasm of uncertain behavior of skin: Secondary | ICD-10-CM | POA: Diagnosis not present

## 2017-10-12 ENCOUNTER — Encounter: Payer: Self-pay | Admitting: *Deleted

## 2017-10-12 ENCOUNTER — Emergency Department: Payer: 59

## 2017-10-12 ENCOUNTER — Other Ambulatory Visit: Payer: Self-pay

## 2017-10-12 ENCOUNTER — Emergency Department
Admission: EM | Admit: 2017-10-12 | Discharge: 2017-10-12 | Disposition: A | Discharge status: Home / Self Care | Payer: 59 | Attending: Emergency Medicine | Admitting: Emergency Medicine

## 2017-10-12 DIAGNOSIS — R079 Chest pain, unspecified: Secondary | ICD-10-CM | POA: Diagnosis not present

## 2017-10-12 DIAGNOSIS — F17210 Nicotine dependence, cigarettes, uncomplicated: Secondary | ICD-10-CM | POA: Diagnosis not present

## 2017-10-12 DIAGNOSIS — M546 Pain in thoracic spine: Secondary | ICD-10-CM | POA: Insufficient documentation

## 2017-10-12 DIAGNOSIS — R0789 Other chest pain: Secondary | ICD-10-CM | POA: Diagnosis not present

## 2017-10-12 DIAGNOSIS — R1110 Vomiting, unspecified: Secondary | ICD-10-CM | POA: Diagnosis not present

## 2017-10-12 LAB — BASIC METABOLIC PANEL
Anion gap: 7 (ref 5–15)
BUN: 12 mg/dL (ref 6–20)
CALCIUM: 9.8 mg/dL (ref 8.9–10.3)
CO2: 25 mmol/L (ref 22–32)
Chloride: 104 mmol/L (ref 98–111)
Creatinine, Ser: 0.68 mg/dL (ref 0.44–1.00)
GFR calc Af Amer: >60 mL/min (ref >60)
GFR calc non Af Amer: >60 mL/min (ref >60)
GLUCOSE: 115 mg/dL — AB (ref 70–99)
Potassium: 4.4 mmol/L (ref 3.5–5.1)
Sodium: 136 mmol/L (ref 135–145)

## 2017-10-12 LAB — TROPONIN I

## 2017-10-12 LAB — CBC
HCT: 42.5 % (ref 35.0–47.0)
Hemoglobin: 14.1 g/dL (ref 12.0–16.0)
MCH: 28.7 pg (ref 26.0–34.0)
MCHC: 33.2 g/dL (ref 32.0–36.0)
MCV: 86.4 fL (ref 80.0–100.0)
Platelets: 188 10*3/uL (ref 150–440)
RBC: 4.92 MIL/uL (ref 3.80–5.20)
RDW: 16.3 % — AB (ref 11.5–14.5)
WBC: 9.1 10*3/uL (ref 3.6–11.0)

## 2017-10-12 LAB — FIBRIN DERIVATIVES D-DIMER (ARMC ONLY): Fibrin derivatives D-dimer (ARMC): 408.64 ng/mL (FEU) (ref 0.00–499.00)

## 2017-10-12 NOTE — ED Triage Notes (Signed)
Pt ambulatory to triage.  Pt has upper back and chest pain for 2 days.  Pt reports n/v yesterday.  Pain radiates into neck.  Pt alert.

## 2017-10-12 NOTE — ED Provider Notes (Signed)
[facility] Emergency Department Provider Note       Time seen: ----------------------------------------- 4:03 PM on 10/12/2017 -----------------------------------------   I have reviewed the triage vital signs and the nursing notes.  HISTORY   Chief Complaint Chest Pain    HPI Joanne Galloway is a 47 y.o. female with a history of anxiety, arthritis, depression, migraine headaches who presents to the ED for chest pain and upper back pain for the past 2 days.  She reports profuse vomiting yesterday.  Pain seems to radiate into her neck on the left side which is the reason she presented here for evaluation.  She is never had this before.  She denies any current medical problems or recent illness.  Past Medical History:  Diagnosis Date  . Anxiety   . Arthritis    KNEES BIL  . Depression   . GERD (gastroesophageal reflux disease)   . Headache    H/O MIGRAINES    There are no active problems to display for this patient.   Past Surgical History:  Procedure Laterality Date  . ABDOMINAL HYSTERECTOMY    . CESAREAN SECTION    . CHONDROPLASTY Right 11/07/2016   Procedure: CHONDROPLASTY;  Surgeon: Dereck Leep, MD;  Location: [facility] ORS;  Service: Orthopedics;  Laterality: Right;  . KNEE ARTHROSCOPY    . KNEE ARTHROSCOPY Right 11/07/2016   Procedure: ARTHROSCOPY KNEE;  Surgeon: Dereck Leep, MD;  Location: [facility] ORS;  Service: Orthopedics;  Laterality: Right;  . MENISCUS REPAIR Right 11/07/2016   Procedure: REPAIR OF MENISCUS;  Surgeon: Dereck Leep, MD;  Location: [facility] ORS;  Service: Orthopedics;  Laterality: Right;  . TUMMY TUCK      Allergies Codeine  Social History Social History   Tobacco Use  . Smoking status: Current Some Day Smoker    Packs/day: 1.00    Years: 4.00    Pack years: 4.00    Types: Cigarettes  . Smokeless tobacco: Never Used  Substance Use Topics  . Alcohol use: No  . Drug use: No   Review of Systems Constitutional:  Negative for fever. Cardiovascular: As if her chest pain Respiratory: Negative for shortness of breath. Gastrointestinal: Negative for abdominal pain, positive for recent vomiting Musculoskeletal: Positive for back and neck pain Skin: Negative for rash. Neurological: Negative for headaches, focal weakness or numbness.  All systems negative/normal/unremarkable except as stated in the HPI  ____________________________________________   PHYSICAL EXAM:  VITAL SIGNS: ED Triage Vitals [10/12/17 1537]  Enc Vitals Group     BP      Pulse Rate 73     Resp 18     Temp 98.4 F (36.9 C)     Temp Source Oral     SpO2 98 %     Weight 260 lb (117.9 kg)     Height 5\' 6"  (1.676 m)     Head Circumference      Peak Flow      Pain Score 3     Pain Loc      Pain Edu?      Excl. in [place]?    Constitutional: Alert and oriented. Well appearing and in no distress. Eyes: Conjunctivae are normal. Normal extraocular movements. ENT   Head: Normocephalic and atraumatic.   Nose: No congestion/rhinnorhea.   Mouth/Throat: Mucous membranes are moist.   Neck: No stridor. Cardiovascular: Normal rate, regular rhythm. No murmurs, rubs, or gallops. Respiratory: Normal respiratory effort without tachypnea nor retractions. Breath sounds are clear and equal bilaterally.  No wheezes/rales/rhonchi. Gastrointestinal: Soft and nontender. Normal bowel sounds Musculoskeletal: Nontender with normal range of motion in extremities. No lower extremity tenderness nor edema. Neurologic:  Normal speech and language. No gross focal neurologic deficits are appreciated.  Skin:  Skin is warm, dry and intact. No rash noted. Psychiatric: Mood and affect are normal. Speech and behavior are normal.  ____________________________________________  EKG: Interpreted by me.  Sinus rhythm rate 72 bpm, normal PR interval, normal QRS, normal QT.  ____________________________________________  ED COURSE:  As part of my  medical decision making, I reviewed the following data within the [place] History obtained from family if available, nursing notes, old chart and ekg, as well as notes from prior ED visits. Patient presented for chest and back pain, we will assess with labs and imaging as indicated at this time.   Procedures ____________________________________________   LABS (pertinent positives/negatives)  Labs Reviewed  BASIC METABOLIC PANEL - Abnormal; Notable for the following components:      Result Value   Glucose, Bld 115 (*)    All other components within normal limits  CBC - Abnormal; Notable for the following components:   RDW 16.3 (*)    All other components within normal limits  TROPONIN I  FIBRIN DERIVATIVES D-DIMER ([facility] ONLY)    RADIOLOGY Images were viewed by me  Chest x-ray is unremarkable  ____________________________________________  DIFFERENTIAL DIAGNOSIS   Musculoskeletal pain, GERD, anxiety, PE, dissection, unstable angina  FINAL ASSESSMENT AND PLAN  Chest pain, back pain   Plan: The patient had presented for chest and back pain. Patient's labs were unremarkable including d-dimer.  It is unlikely that she either has a PE or dissection given her normal d-dimer. Patient's imaging was negative for any acute process.  This is likely musculoskeletal in origin.  She will be referred to cardiology for close outpatient follow-up.   Laurence Aly, MD   Note: This note was generated in part or whole with voice recognition software. Voice recognition is usually quite accurate but there are transcription errors that can and very often do occur. I apologize for any typographical errors that were not detected and corrected.     Earleen Newport, MD 10/12/17 [phone]

## 2017-12-17 ENCOUNTER — Emergency Department
Admission: EM | Admit: 2017-12-17 | Discharge: 2017-12-17 | Disposition: A | Discharge status: Home / Self Care | Payer: 59 | Attending: Emergency Medicine | Admitting: Emergency Medicine

## 2017-12-17 ENCOUNTER — Encounter: Payer: Self-pay | Admitting: Emergency Medicine

## 2017-12-17 ENCOUNTER — Emergency Department: Payer: 59

## 2017-12-17 DIAGNOSIS — M25562 Pain in left knee: Secondary | ICD-10-CM | POA: Diagnosis not present

## 2017-12-17 DIAGNOSIS — F17210 Nicotine dependence, cigarettes, uncomplicated: Secondary | ICD-10-CM | POA: Diagnosis not present

## 2017-12-17 DIAGNOSIS — Z79899 Other long term (current) drug therapy: Secondary | ICD-10-CM | POA: Insufficient documentation

## 2017-12-17 MED ORDER — NAPROXEN 500 MG PO TABS
500.0000 mg | ORAL_TABLET | Freq: Once | ORAL | Status: AC
  Administered 2017-12-17: 500 mg via ORAL
  Filled 2017-12-17: qty 1

## 2017-12-17 MED ORDER — HYDROCODONE-ACETAMINOPHEN 5-325 MG PO TABS
1.0000 | ORAL_TABLET | Freq: Once | ORAL | Status: AC
Start: 2017-12-17 — End: 2017-12-17
  Administered 2017-12-17: 1 via ORAL
  Filled 2017-12-17: qty 1

## 2017-12-17 MED ORDER — MELOXICAM 15 MG PO TABS: 15.0000 mg | ORAL_TABLET | Freq: Every day | ORAL | 0 refills | Status: AC

## 2017-12-17 MED ORDER — HYDROCODONE-ACETAMINOPHEN 5-325 MG PO TABS: 1.0000 | ORAL_TABLET | ORAL | 0 refills | Status: AC | PRN

## 2017-12-17 NOTE — ED Provider Notes (Signed)
[facility] Emergency Department Provider Note ____________________________________________  Time seen: Approximately 8:35 PM  I have reviewed the triage vital signs and the nursing notes.   HISTORY  Chief Complaint Knee Pain    HPI Chalene Treu is a 47 y.o. female who presents to the emergency department for evaluation and treatment of left knee pain. She denies specific injury but has been working out at the gym for the past week. She reports a history of meniscal repair several years ago and pain seems similar. No relief with OTC medication.  Past Medical History:  Diagnosis Date  . Anxiety   . Arthritis    KNEES BIL  . Depression   . GERD (gastroesophageal reflux disease)   . Headache    H/O MIGRAINES    There are no active problems to display for this patient.   Past Surgical History:  Procedure Laterality Date  . ABDOMINAL HYSTERECTOMY    . CESAREAN SECTION    . CHONDROPLASTY Right 11/07/2016   Procedure: CHONDROPLASTY;  Surgeon: Dereck Leep, MD;  Location: [facility] ORS;  Service: Orthopedics;  Laterality: Right;  . KNEE ARTHROSCOPY    . KNEE ARTHROSCOPY Right 11/07/2016   Procedure: ARTHROSCOPY KNEE;  Surgeon: Dereck Leep, MD;  Location: [facility] ORS;  Service: Orthopedics;  Laterality: Right;  . MENISCUS REPAIR Right 11/07/2016   Procedure: REPAIR OF MENISCUS;  Surgeon: Dereck Leep, MD;  Location: [facility] ORS;  Service: Orthopedics;  Laterality: Right;  . TUMMY TUCK      Prior to Admission medications   Medication Sig Start Date End Date Taking? Authorizing Provider  HYDROcodone-acetaminophen (NORCO/VICODIN) 5-325 MG tablet Take 1 tablet by mouth every 4 (four) hours as needed for moderate pain. 12/17/17 12/17/18  Tivon Lemoine, Johnette Abraham B, FNP  meloxicam (MOBIC) 15 MG tablet Take 1 tablet (15 mg total) by mouth daily. 12/17/17   Jehan Ranganathan, Johnette Abraham B, FNP  Multiple Vitamin (MULTI-VITAMIN PO) Take 5 tablets by mouth 2 (two) times daily. Vitamin pack     [provider]  omeprazole (PRILOSEC) 40 MG capsule Take 40 mg by mouth every morning.  09/01/16   [provider]    Allergies Codeine  No family history on file.  Social History Social History   Tobacco Use  . Smoking status: Current Some Day Smoker    Packs/day: 1.00    Years: 4.00    Pack years: 4.00    Types: Cigarettes  . Smokeless tobacco: Never Used  Substance Use Topics  . Alcohol use: No  . Drug use: No    Review of Systems Constitutional: Negative for fever. Cardiovascular: Negative for chest pain. Respiratory: Negative for shortness of breath. Musculoskeletal: Positive for left knee strain. Skin: Negative for open wound or lesion.  Neurological: Negative for decrease in sensation  ____________________________________________   PHYSICAL EXAM:  VITAL SIGNS: ED Triage Vitals [12/17/17 1937]  Enc Vitals Group     BP 132/82     Pulse Rate 62     Resp 18     Temp 98 F (36.7 C)     Temp Source Oral     SpO2 97 %     Weight 260 lb (117.9 kg)     Height 5\' 7"  (1.702 m)     Head Circumference      Peak Flow      Pain Score 8     Pain Loc      Pain Edu?      Excl. in [place]?  Constitutional: Alert and oriented. Well appearing and in no acute distress. Eyes: Conjunctivae are clear without discharge or drainage Head: Atraumatic Neck: Supple Respiratory: No cough. Respirations are even and unlabored. Musculoskeletal: Left knee: Able to perform straight leg raise, Pain on valgus stressing without laxity, flexion to about 90 degrees is possible but painful. Neurologic: Motor and sensory function is intact.  Skin: Intact without lesion or wound.   Psychiatric: Affect and behavior are appropriate.  ____________________________________________   LABS (all labs ordered are listed, but only abnormal results are displayed)  Labs Reviewed - No data to display ____________________________________________  RADIOLOGY  Left knee is  negative for acute findings per radiology. ____________________________________________   PROCEDURES  Procedures  ____________________________________________   INITIAL IMPRESSION / ASSESSMENT AND PLAN / ED COURSE  Vianka Ertel is a 47 y.o. who presents to the emergency department for treatment and evaluation of left knee pain. Xray is reassuring but does show arthritis. Results were discussed with the patient and family who will follow up with orthopedics. She will be treated with meloxicam and norco and placed in a knee immobilizer. She is to return to the ER for symptoms that change or worsen if unable to schedule an appointment with orthopedics or primary care.  Medications  HYDROcodone-acetaminophen (NORCO/VICODIN) 5-325 MG per tablet 1 tablet (1 tablet Oral Given 12/17/17 2051)  naproxen (NAPROSYN) tablet 500 mg (500 mg Oral Given 12/17/17 2051)    Pertinent labs & imaging results that were available during my care of the patient were reviewed by me and considered in my medical decision making (see chart for details).  _________________________________________   FINAL CLINICAL IMPRESSION(S) / ED DIAGNOSES  Final diagnoses:  Acute pain of left knee    ED Discharge Orders         Ordered    HYDROcodone-acetaminophen (NORCO/VICODIN) 5-325 MG tablet  Every 4 hours PRN     12/17/17 2043    meloxicam (MOBIC) 15 MG tablet  Daily     12/17/17 2043           If controlled substance prescribed during this visit, 12 month history viewed on the [place] prior to issuing an initial prescription for Schedule II or III opiod.    Victorino Dike, FNP 12/17/17 2052    Carrie Mew, MD 12/17/17 [id number]

## 2017-12-17 NOTE — ED Triage Notes (Signed)
Patient with complaint of left knee pain times one week. Patient states that she is unsure of any injury but states that she has been working out at [employer]. Patient states that she has a history of torn meniscus in her left knee and concerned she may have torn it again. Patient also states that she has arthritis in bilateral knees.

## 2017-12-25 DIAGNOSIS — M175 Other unilateral secondary osteoarthritis of knee: Secondary | ICD-10-CM | POA: Diagnosis not present

## 2017-12-25 DIAGNOSIS — M25562 Pain in left knee: Secondary | ICD-10-CM | POA: Diagnosis not present

## 2018-06-27 DIAGNOSIS — L821 Other seborrheic keratosis: Secondary | ICD-10-CM | POA: Diagnosis not present

## 2018-06-27 DIAGNOSIS — X32XXXA Exposure to sunlight, initial encounter: Secondary | ICD-10-CM | POA: Diagnosis not present

## 2018-06-27 DIAGNOSIS — L814 Other melanin hyperpigmentation: Secondary | ICD-10-CM | POA: Diagnosis not present

## 2018-10-11 ENCOUNTER — Other Ambulatory Visit: Payer: Self-pay | Admitting: Orthopedic Surgery

## 2018-10-11 DIAGNOSIS — M1712 Unilateral primary osteoarthritis, left knee: Secondary | ICD-10-CM

## 2018-10-11 DIAGNOSIS — M25562 Pain in left knee: Secondary | ICD-10-CM

## 2018-10-17 ENCOUNTER — Ambulatory Visit
Admission: RE | Admit: 2018-10-17 | Discharge: 2018-10-17 | Disposition: A | Discharge status: Home / Self Care | Payer: 59 | Source: Ambulatory Visit | Attending: Orthopedic Surgery | Admitting: Orthopedic Surgery

## 2018-10-17 ENCOUNTER — Other Ambulatory Visit: Payer: Self-pay

## 2018-10-17 DIAGNOSIS — M25562 Pain in left knee: Secondary | ICD-10-CM | POA: Diagnosis present

## 2018-10-17 DIAGNOSIS — M1712 Unilateral primary osteoarthritis, left knee: Secondary | ICD-10-CM | POA: Insufficient documentation

## 2018-12-26 ENCOUNTER — Other Ambulatory Visit: Payer: 59

## 2018-12-28 ENCOUNTER — Other Ambulatory Visit: Payer: 59

## 2019-01-02 ENCOUNTER — Encounter: Admission: RE | Payer: Self-pay | Source: Home / Self Care

## 2019-01-02 ENCOUNTER — Ambulatory Visit: Admission: RE | Admit: 2019-01-02 | Payer: 59 | Source: Home / Self Care | Admitting: Orthopedic Surgery

## 2019-01-02 SURGERY — ARTHROSCOPY, KNEE
Anesthesia: Choice | Site: Knee | Laterality: Left

## 2020-03-07 IMAGING — DX DG KNEE COMPLETE 4+V*L*
4 series · 4 of 4 positions shown · non-contrast
Comparison: None.

CLINICAL DATA: One week of left knee pain.  Initial encounter.

EXAM:
LEFT KNEE - COMPLETE 4+ VIEW

[knee ap]
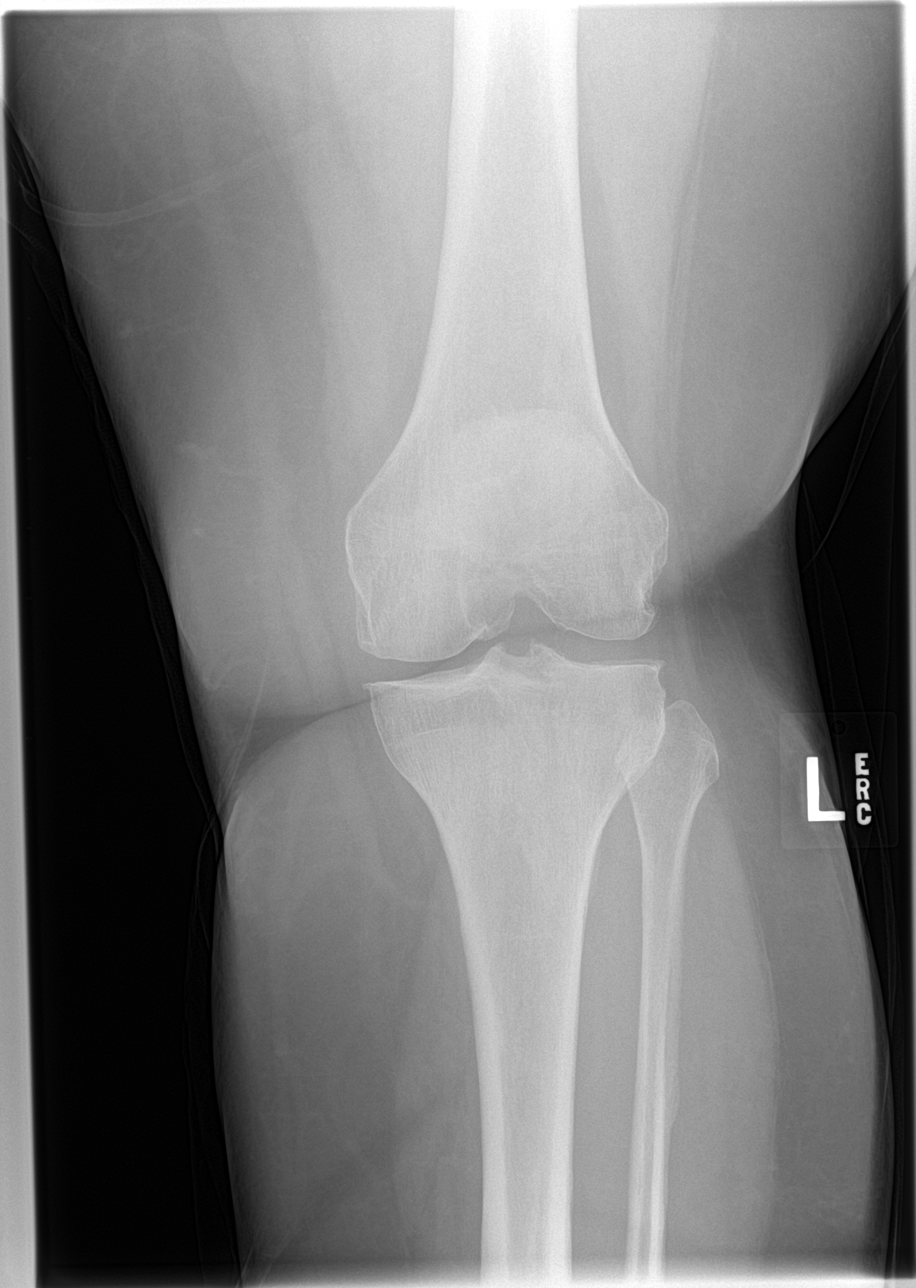

[knee tunnel]
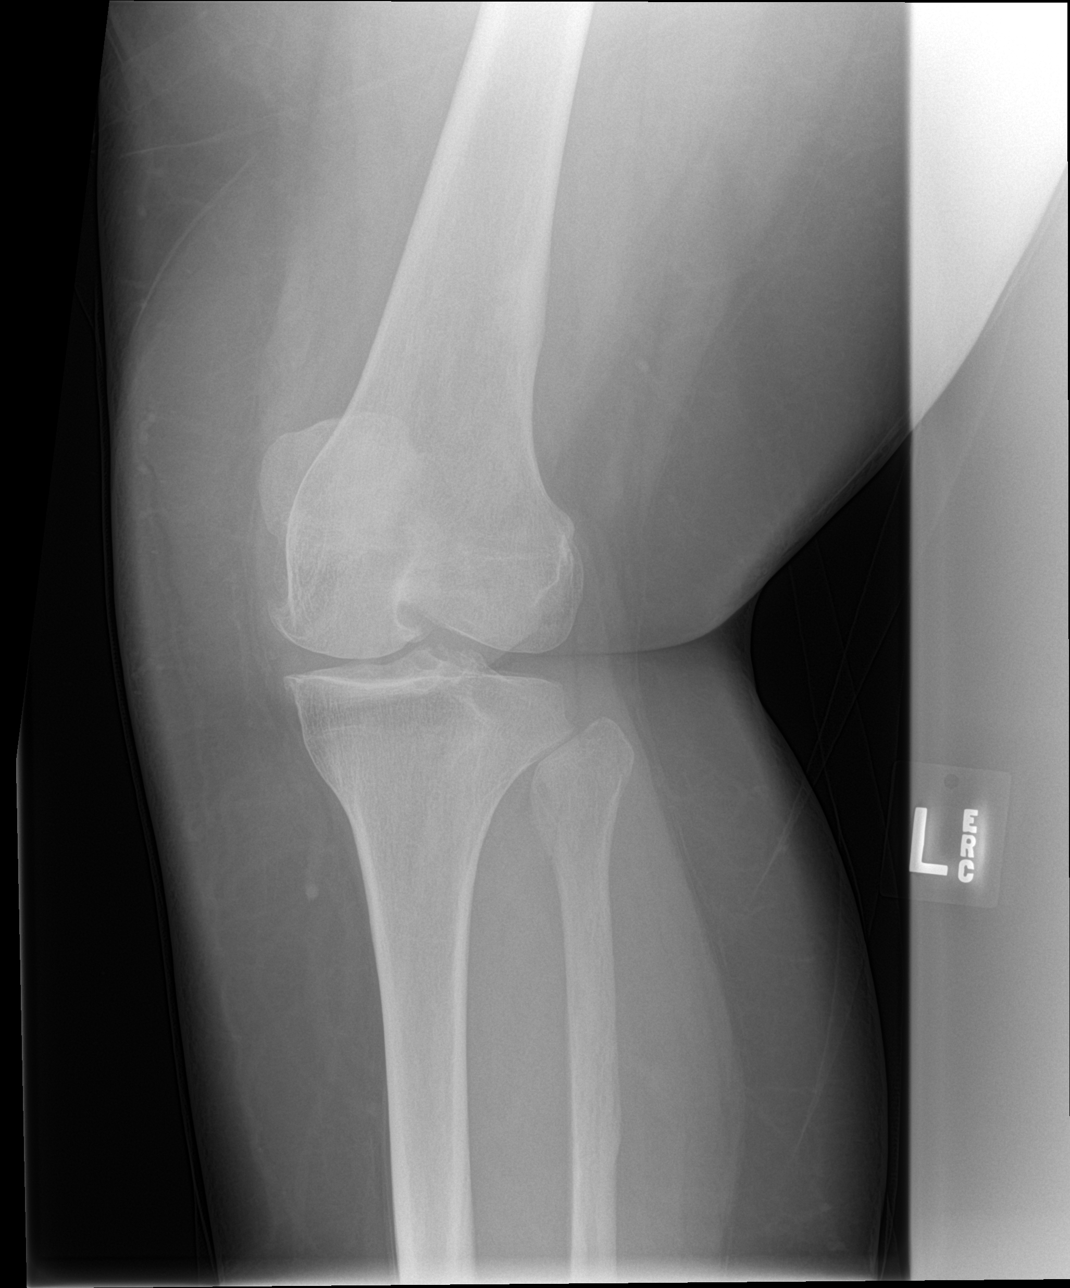

[knee lat]
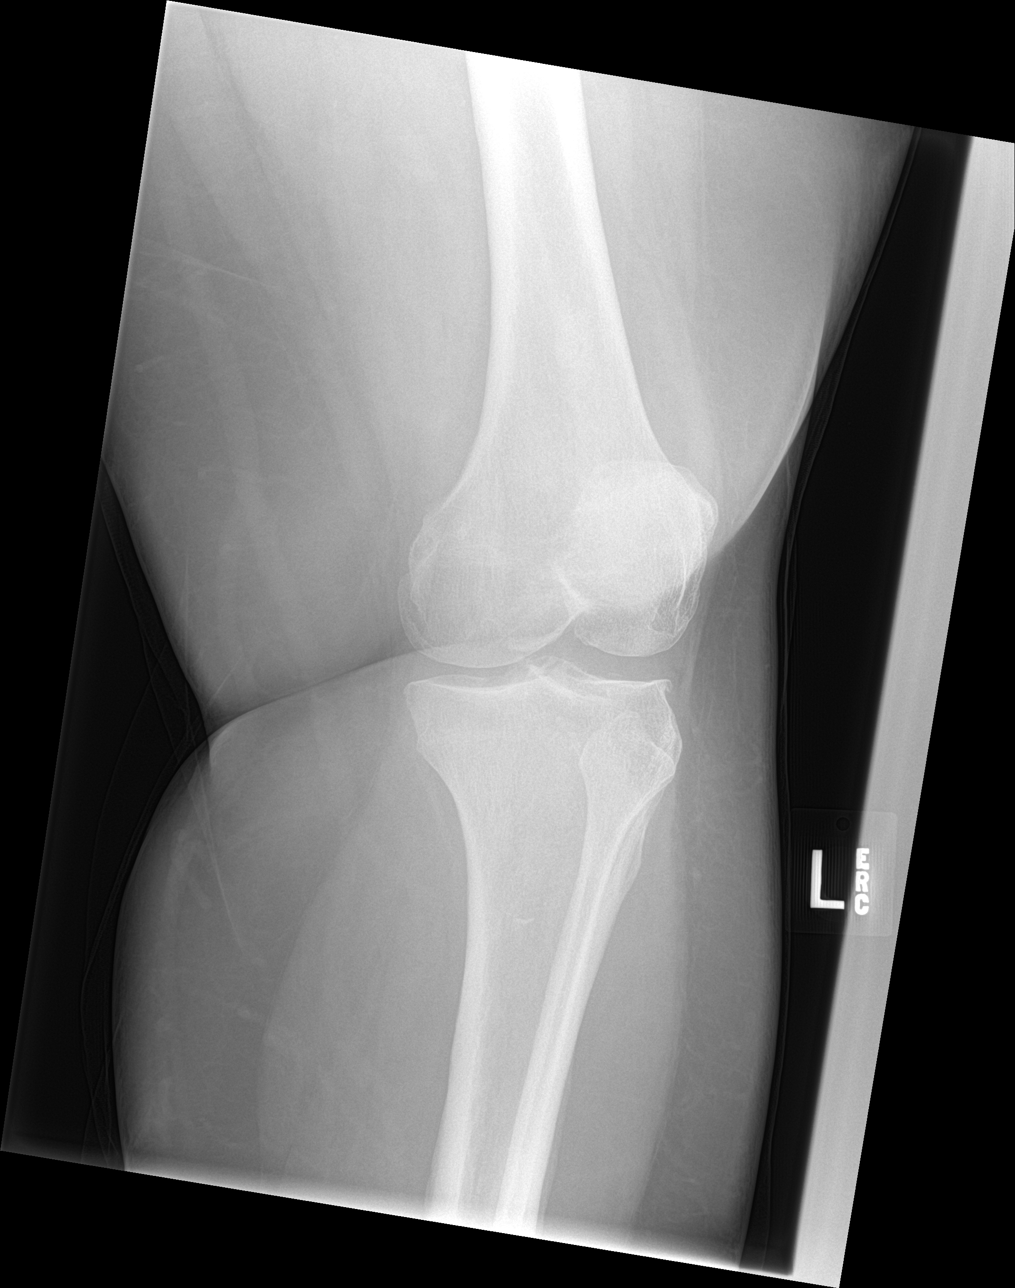

[patella skyline]
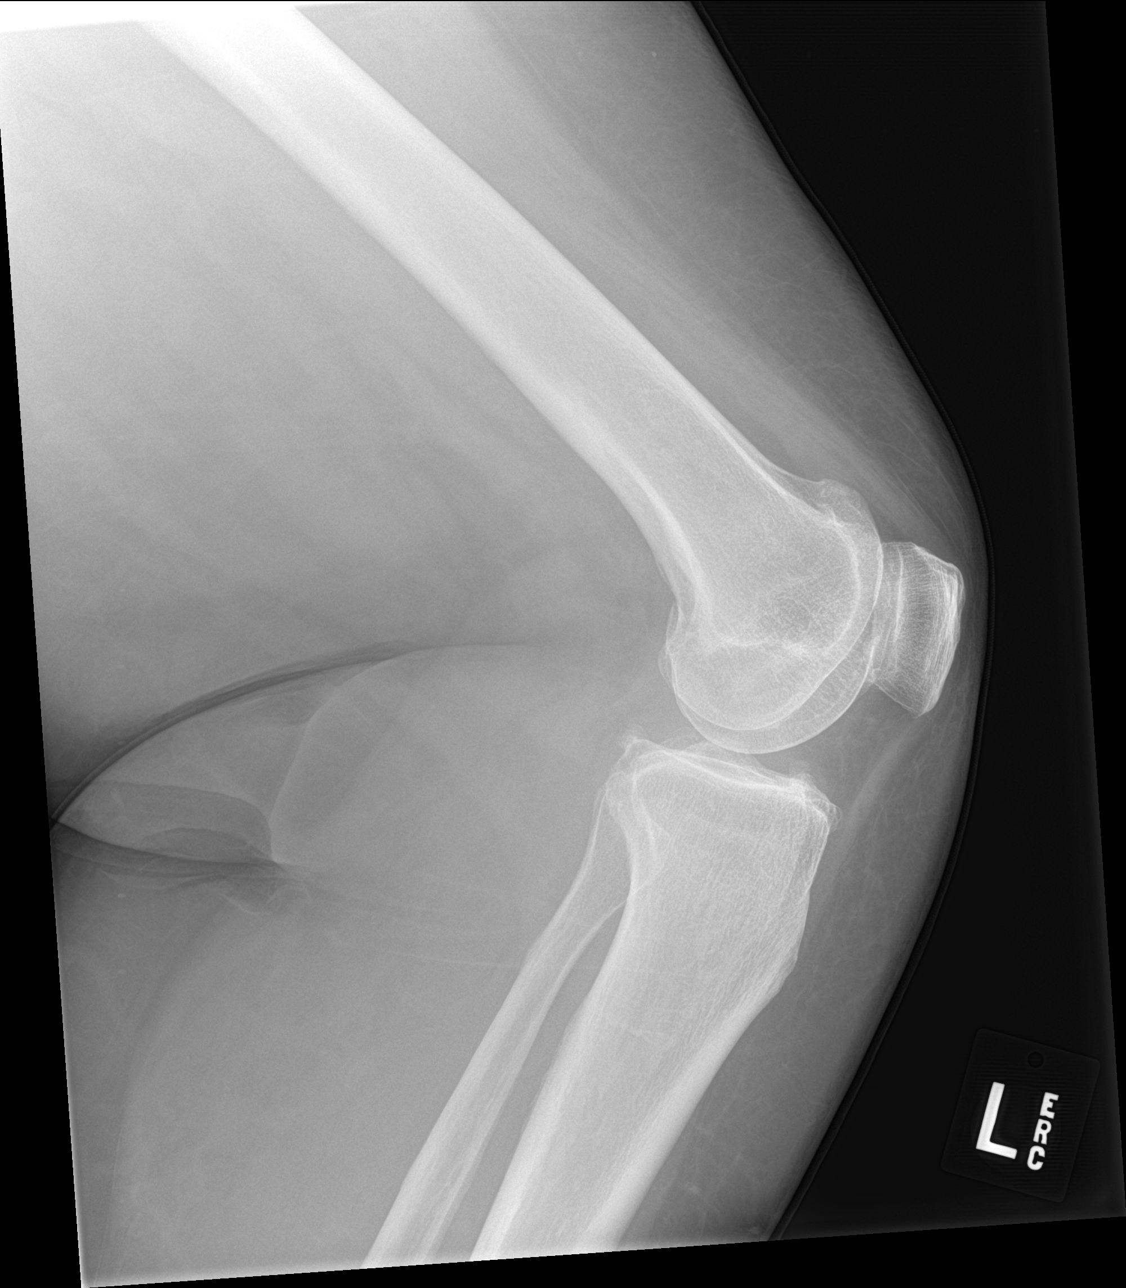

[4 of 4 positions shown; findings below may reference images not displayed]

FINDINGS: No evidence of fracture, dislocation, or joint effusion.
Degenerative marginal spurring without definite joint narrowing.
IMPRESSION: 1. No acute finding.
2. Degenerative spurring.

## 2020-10-11 LAB — COLOGUARD: COLOGUARD: NEGATIVE

## 2020-10-11 LAB — EXTERNAL GENERIC LAB PROCEDURE: COLOGUARD: NEGATIVE

## 2022-02-21 ENCOUNTER — Other Ambulatory Visit: Payer: Self-pay | Admitting: Internal Medicine

## 2022-02-21 DIAGNOSIS — F17210 Nicotine dependence, cigarettes, uncomplicated: Secondary | ICD-10-CM

## 2022-02-28 ENCOUNTER — Ambulatory Visit
Admission: RE | Admit: 2022-02-28 | Discharge: 2022-02-28 | Disposition: A | Discharge status: Home / Self Care | Payer: 59 | Source: Ambulatory Visit | Attending: Internal Medicine | Admitting: Internal Medicine

## 2022-02-28 DIAGNOSIS — F17210 Nicotine dependence, cigarettes, uncomplicated: Secondary | ICD-10-CM

## 2023-03-08 ENCOUNTER — Other Ambulatory Visit: Payer: Self-pay | Admitting: Internal Medicine

## 2023-03-08 DIAGNOSIS — F17210 Nicotine dependence, cigarettes, uncomplicated: Secondary | ICD-10-CM

## 2023-03-15 ENCOUNTER — Ambulatory Visit
Admission: RE | Admit: 2023-03-15 | Discharge: 2023-03-15 | Disposition: A | Discharge status: Home / Self Care | Payer: 59 | Source: Ambulatory Visit | Attending: Internal Medicine | Admitting: Internal Medicine

## 2023-03-15 DIAGNOSIS — F17210 Nicotine dependence, cigarettes, uncomplicated: Secondary | ICD-10-CM

## 2023-08-21 ENCOUNTER — Ambulatory Visit: Payer: Self-pay | Admitting: Internal Medicine

## 2023-08-26 LAB — COLOGUARD: COLOGUARD: NEGATIVE

## 2024-04-19 ENCOUNTER — Other Ambulatory Visit: Payer: Self-pay

## 2024-04-19 DIAGNOSIS — M2392 Unspecified internal derangement of left knee: Secondary | ICD-10-CM

## 2024-04-28 ENCOUNTER — Other Ambulatory Visit

## 2024-05-04 ENCOUNTER — Ambulatory Visit
Admission: RE | Admit: 2024-05-04 | Discharge: 2024-05-04 | Disposition: A | Discharge status: Home / Self Care | Source: Ambulatory Visit | Attending: Internal Medicine | Admitting: Internal Medicine

## 2024-05-04 DIAGNOSIS — M2392 Unspecified internal derangement of left knee: Secondary | ICD-10-CM
